# Patient Record
Sex: Male | Born: 1970 | Race: Black or African American | Hispanic: No | Smoking: Never smoker
Health system: Southern US, Community
[De-identification: ages and names within clinical notes are randomized; demographics above are authoritative.]

## PROBLEM LIST (undated history)

## (undated) DIAGNOSIS — S02609A Fracture of mandible, unspecified, initial encounter for closed fracture: Secondary | ICD-10-CM

## (undated) HISTORY — PX: CHOLECYSTECTOMY: SHX55

---

## 2016-07-08 ENCOUNTER — Emergency Department (HOSPITAL_COMMUNITY): Payer: Self-pay | Admitting: Anesthesiology

## 2016-07-08 ENCOUNTER — Inpatient Hospital Stay: Admit: 2016-07-08 | Payer: Self-pay | Admitting: Otolaryngology

## 2016-07-08 ENCOUNTER — Encounter (HOSPITAL_COMMUNITY)
Admission: EM | Disposition: A | Discharge status: Home / Self Care | Payer: Self-pay | Source: Home / Self Care | Attending: Emergency Medicine

## 2016-07-08 ENCOUNTER — Emergency Department (HOSPITAL_COMMUNITY): Payer: Self-pay

## 2016-07-08 ENCOUNTER — Ambulatory Visit (HOSPITAL_COMMUNITY)
Admission: EM | Admit: 2016-07-08 | Discharge: 2016-07-08 | Disposition: A | Discharge status: Home / Self Care | Payer: Self-pay | Attending: Emergency Medicine | Admitting: Emergency Medicine

## 2016-07-08 ENCOUNTER — Encounter (HOSPITAL_COMMUNITY): Payer: Self-pay

## 2016-07-08 DIAGNOSIS — Y998 Other external cause status: Secondary | ICD-10-CM | POA: Insufficient documentation

## 2016-07-08 DIAGNOSIS — Y9289 Other specified places as the place of occurrence of the external cause: Secondary | ICD-10-CM | POA: Insufficient documentation

## 2016-07-08 DIAGNOSIS — Y9389 Activity, other specified: Secondary | ICD-10-CM | POA: Insufficient documentation

## 2016-07-08 DIAGNOSIS — S02601B Fracture of unspecified part of body of right mandible, initial encounter for open fracture: Secondary | ICD-10-CM

## 2016-07-08 DIAGNOSIS — S02609B Fracture of mandible, unspecified, initial encounter for open fracture: Secondary | ICD-10-CM

## 2016-07-08 DIAGNOSIS — S02642A Fracture of ramus of left mandible, initial encounter for closed fracture: Secondary | ICD-10-CM | POA: Insufficient documentation

## 2016-07-08 DIAGNOSIS — S0266XA Fracture of symphysis of mandible, initial encounter for closed fracture: Secondary | ICD-10-CM | POA: Insufficient documentation

## 2016-07-08 HISTORY — PX: ORIF MANDIBULAR FRACTURE: SHX2127

## 2016-07-08 LAB — CBC WITH DIFFERENTIAL/PLATELET
BASOS ABS: 0 10*3/uL (ref 0.0–0.1)
Basophils Relative: 0 %
Eosinophils Absolute: 0.1 10*3/uL (ref 0.0–0.7)
Eosinophils Relative: 1 %
HEMATOCRIT: 39.1 % (ref 39.0–52.0)
HEMOGLOBIN: 13.6 g/dL (ref 13.0–17.0)
Lymphocytes Relative: 11 %
Lymphs Abs: 1 10*3/uL (ref 0.7–4.0)
MCH: 29.9 pg (ref 26.0–34.0)
MCHC: 34.8 g/dL (ref 30.0–36.0)
MCV: 85.9 fL (ref 78.0–100.0)
MONOS PCT: 4 %
Monocytes Absolute: 0.4 10*3/uL (ref 0.1–1.0)
NEUTROS ABS: 7.6 10*3/uL (ref 1.7–7.7)
NEUTROS PCT: 84 %
Platelets: 236 10*3/uL (ref 150–400)
RBC: 4.55 MIL/uL (ref 4.22–5.81)
RDW: 13.1 % (ref 11.5–15.5)
WBC: 9.1 10*3/uL (ref 4.0–10.5)

## 2016-07-08 LAB — BASIC METABOLIC PANEL
ANION GAP: 9 (ref 5–15)
BUN: 13 mg/dL (ref 6–20)
CO2: 27 mmol/L (ref 22–32)
Calcium: 9 mg/dL (ref 8.9–10.3)
Chloride: 103 mmol/L (ref 101–111)
Creatinine, Ser: 1.12 mg/dL (ref 0.61–1.24)
Glucose, Bld: 122 mg/dL — ABNORMAL HIGH (ref 65–99)
Potassium: 3.8 mmol/L (ref 3.5–5.1)
Sodium: 139 mmol/L (ref 135–145)

## 2016-07-08 SURGERY — OPEN REDUCTION INTERNAL FIXATION (ORIF) MANDIBULAR FRACTURE
Anesthesia: General | Site: Mouth | Laterality: Bilateral

## 2016-07-08 MED ORDER — PROPOFOL 10 MG/ML IV BOLUS
INTRAVENOUS | Status: AC
  Filled 2016-07-08: qty 40

## 2016-07-08 MED ORDER — LIDOCAINE HCL (CARDIAC) 20 MG/ML IV SOLN
INTRAVENOUS | Status: DC | PRN
  Administered 2016-07-08: 100 mg via INTRAVENOUS

## 2016-07-08 MED ORDER — AMOXICILLIN-POT CLAVULANATE 250-62.5 MG/5ML PO SUSR: 10.0000 mL | Freq: Two times a day (BID) | ORAL | 0 refills | Status: DC

## 2016-07-08 MED ORDER — PROPOFOL 10 MG/ML IV BOLUS
INTRAVENOUS | Status: DC | PRN
  Administered 2016-07-08: 200 mg via INTRAVENOUS

## 2016-07-08 MED ORDER — FENTANYL CITRATE (PF) 100 MCG/2ML IJ SOLN
50.0000 ug | Freq: Once | INTRAMUSCULAR | Status: AC
  Administered 2016-07-08: 50 ug via INTRAVENOUS
  Filled 2016-07-08: qty 2

## 2016-07-08 MED ORDER — SODIUM CHLORIDE 0.9 % IV SOLN
Freq: Once | INTRAVENOUS | Status: AC
  Administered 2016-07-08: 08:00:00 via INTRAVENOUS

## 2016-07-08 MED ORDER — CEFAZOLIN SODIUM 1 G IJ SOLR
2.0000 g | Freq: Once | INTRAMUSCULAR | Status: AC
  Administered 2016-07-08: 2 g via INTRAMUSCULAR
  Filled 2016-07-08: qty 20

## 2016-07-08 MED ORDER — ARTIFICIAL TEARS OP OINT
TOPICAL_OINTMENT | OPHTHALMIC | Status: AC
  Filled 2016-07-08: qty 3.5

## 2016-07-08 MED ORDER — HYDROMORPHONE HCL 1 MG/ML IJ SOLN
0.5000 mg | Freq: Once | INTRAMUSCULAR | Status: AC
  Administered 2016-07-08: 0.5 mg via INTRAVENOUS
  Filled 2016-07-08: qty 1

## 2016-07-08 MED ORDER — MIDAZOLAM HCL 2 MG/2ML IJ SOLN
INTRAMUSCULAR | Status: AC
  Filled 2016-07-08: qty 2

## 2016-07-08 MED ORDER — CEFAZOLIN SODIUM 1 G IJ SOLR
INTRAMUSCULAR | Status: AC
  Filled 2016-07-08: qty 20

## 2016-07-08 MED ORDER — FENTANYL CITRATE (PF) 100 MCG/2ML IJ SOLN
INTRAMUSCULAR | Status: DC | PRN
  Administered 2016-07-08: 150 ug via INTRAVENOUS
  Administered 2016-07-08: 50 ug via INTRAVENOUS

## 2016-07-08 MED ORDER — SUCCINYLCHOLINE CHLORIDE 200 MG/10ML IV SOSY
PREFILLED_SYRINGE | INTRAVENOUS | Status: AC
  Filled 2016-07-08: qty 10

## 2016-07-08 MED ORDER — DEXMEDETOMIDINE HCL 200 MCG/2ML IV SOLN
INTRAVENOUS | Status: DC | PRN
  Administered 2016-07-08 (×2): 8 ug via INTRAVENOUS

## 2016-07-08 MED ORDER — ARTIFICIAL TEARS OP OINT
TOPICAL_OINTMENT | OPHTHALMIC | Status: DC | PRN
  Administered 2016-07-08: 1 via OPHTHALMIC

## 2016-07-08 MED ORDER — OXYMETAZOLINE HCL 0.05 % NA SOLN
NASAL | Status: DC | PRN
  Administered 2016-07-08: 1 via NASAL

## 2016-07-08 MED ORDER — ONDANSETRON HCL 4 MG/2ML IJ SOLN
4.0000 mg | Freq: Once | INTRAMUSCULAR | Status: AC
  Administered 2016-07-08: 4 mg via INTRAVENOUS
  Filled 2016-07-08: qty 2

## 2016-07-08 MED ORDER — LIDOCAINE 2% (20 MG/ML) 5 ML SYRINGE
INTRAMUSCULAR | Status: AC
  Filled 2016-07-08: qty 10

## 2016-07-08 MED ORDER — PROMETHAZINE HCL 25 MG/ML IJ SOLN: 6.2500 mg | INTRAMUSCULAR | Status: DC | PRN

## 2016-07-08 MED ORDER — OXYMETAZOLINE HCL 0.05 % NA SOLN
NASAL | Status: DC | PRN
  Administered 2016-07-08: 1 via TOPICAL

## 2016-07-08 MED ORDER — DEXAMETHASONE SODIUM PHOSPHATE 10 MG/ML IJ SOLN
INTRAMUSCULAR | Status: AC
  Filled 2016-07-08: qty 1

## 2016-07-08 MED ORDER — OXYMETAZOLINE HCL 0.05 % NA SOLN
NASAL | Status: AC
  Filled 2016-07-08: qty 15

## 2016-07-08 MED ORDER — ONDANSETRON HCL 4 MG/2ML IJ SOLN
INTRAMUSCULAR | Status: AC
  Filled 2016-07-08: qty 2

## 2016-07-08 MED ORDER — CEFAZOLIN SODIUM 1 G IJ SOLR
INTRAMUSCULAR | Status: DC | PRN
  Administered 2016-07-08: 2 g via INTRAMUSCULAR

## 2016-07-08 MED ORDER — TETANUS-DIPHTH-ACELL PERTUSSIS 5-2.5-18.5 LF-MCG/0.5 IM SUSP
0.5000 mL | Freq: Once | INTRAMUSCULAR | Status: AC
  Administered 2016-07-08: 0.5 mL via INTRAMUSCULAR
  Filled 2016-07-08: qty 0.5

## 2016-07-08 MED ORDER — DEXAMETHASONE SODIUM PHOSPHATE 10 MG/ML IJ SOLN
INTRAMUSCULAR | Status: DC | PRN
  Administered 2016-07-08: 5 mg via INTRAVENOUS

## 2016-07-08 MED ORDER — HYDROCODONE-ACETAMINOPHEN 7.5-325 MG/15ML PO SOLN: 10.0000 mL | ORAL | 0 refills | Status: DC | PRN

## 2016-07-08 MED ORDER — SODIUM CHLORIDE 0.9 % IV SOLN
INTRAVENOUS | Status: DC | PRN
  Administered 2016-07-08: 09:00:00 via INTRAVENOUS

## 2016-07-08 MED ORDER — DEXMEDETOMIDINE HCL IN NACL 200 MCG/50ML IV SOLN
INTRAVENOUS | Status: AC
  Filled 2016-07-08: qty 50

## 2016-07-08 MED ORDER — MIDAZOLAM HCL 5 MG/5ML IJ SOLN
INTRAMUSCULAR | Status: DC | PRN
  Administered 2016-07-08: 1 mg via INTRAVENOUS

## 2016-07-08 MED ORDER — FENTANYL CITRATE (PF) 100 MCG/2ML IJ SOLN
INTRAMUSCULAR | Status: AC
  Filled 2016-07-08: qty 4

## 2016-07-08 MED ORDER — FENTANYL CITRATE (PF) 100 MCG/2ML IJ SOLN
25.0000 ug | INTRAMUSCULAR | Status: DC | PRN
  Administered 2016-07-08: 25 ug via INTRAVENOUS

## 2016-07-08 MED ORDER — ONDANSETRON HCL 4 MG/2ML IJ SOLN
INTRAMUSCULAR | Status: DC | PRN
  Administered 2016-07-08: 4 mg via INTRAVENOUS

## 2016-07-08 MED ORDER — 0.9 % SODIUM CHLORIDE (POUR BTL) OPTIME
TOPICAL | Status: DC | PRN
  Administered 2016-07-08: 1000 mL

## 2016-07-08 MED ORDER — SUCCINYLCHOLINE CHLORIDE 20 MG/ML IJ SOLN
INTRAMUSCULAR | Status: DC | PRN
  Administered 2016-07-08: 100 mg via INTRAVENOUS

## 2016-07-08 MED ORDER — STERILE WATER FOR INJECTION IJ SOLN
INTRAMUSCULAR | Status: AC
Start: 2016-07-08 — End: 2016-07-08
  Administered 2016-07-08: 5 mL
  Filled 2016-07-08: qty 10

## 2016-07-08 MED ORDER — LACTATED RINGERS IV SOLN
INTRAVENOUS | Status: DC | PRN
  Administered 2016-07-08: 10:00:00 via INTRAVENOUS

## 2016-07-08 SURGICAL SUPPLY — 41 items
BLADE SURG 15 STRL LF DISP TIS (BLADE) ×1 IMPLANT
BLADE SURG 15 STRL SS (BLADE) ×1
CANISTER SUCTION 2500CC (MISCELLANEOUS) ×2 IMPLANT
CLEANER TIP ELECTROSURG 2X2 (MISCELLANEOUS) ×2 IMPLANT
DRAPE PROXIMA HALF (DRAPES) IMPLANT
ELECT COATED BLADE 2.86 ST (ELECTRODE) ×2 IMPLANT
ELECT NEEDLE TIP 2.8 STRL (NEEDLE) IMPLANT
ELECT REM PT RETURN 9FT ADLT (ELECTROSURGICAL) ×2
ELECTRODE REM PT RTRN 9FT ADLT (ELECTROSURGICAL) ×1 IMPLANT
GLOVE BIOGEL M 7.0 STRL (GLOVE) ×2 IMPLANT
GOWN STRL REUS W/ TWL LRG LVL3 (GOWN DISPOSABLE) ×2 IMPLANT
GOWN STRL REUS W/TWL LRG LVL3 (GOWN DISPOSABLE) ×2
KIT BASIN OR (CUSTOM PROCEDURE TRAY) ×2 IMPLANT
KIT ROOM TURNOVER OR (KITS) ×2 IMPLANT
NEEDLE HYPO 25GX1X1/2 BEV (NEEDLE) ×2 IMPLANT
NS IRRIG 1000ML POUR BTL (IV SOLUTION) ×2 IMPLANT
PAD ARMBOARD 7.5X6 YLW CONV (MISCELLANEOUS) ×4 IMPLANT
PATTIES SURGICAL .5 X3 (DISPOSABLE) ×2 IMPLANT
PENCIL BUTTON HOLSTER BLD 10FT (ELECTRODE) ×2 IMPLANT
PLATE HYBRID MMF (Plate) ×4 IMPLANT
SCISSORS WIRE DISP (INSTRUMENTS) ×2 IMPLANT
SCREW LOCK SELFDRIL 2.0X8M MMF (Screw) ×22 IMPLANT
SCREW LOCKING SELF DRILL 2.0X6 (Screw) ×16 IMPLANT
STAPLER VISISTAT 35W (STAPLE) ×2 IMPLANT
SUT BONE WAX W31G (SUTURE) IMPLANT
SUT CHROMIC 3 0 SH 27 (SUTURE) IMPLANT
SUT ETHILON 3 0 PS 1 (SUTURE) IMPLANT
SUT SILK 3 0 (SUTURE)
SUT SILK 3 0 SH 30 (SUTURE) IMPLANT
SUT SILK 3-0 18XBRD TIE 12 (SUTURE) IMPLANT
SUT STEEL 0 (SUTURE)
SUT STEEL 0 18XMFL TIE 17 (SUTURE) IMPLANT
SUT STEEL 2 (SUTURE) ×2 IMPLANT
SUT VIC AB 3-0 FS2 27 (SUTURE) IMPLANT
SUT VIC AB 4-0 P-3 18X BRD (SUTURE) IMPLANT
SUT VIC AB 4-0 P3 18 (SUTURE)
SUT VIC AB 5-0 P-3 18XBRD (SUTURE) IMPLANT
SUT VIC AB 5-0 P3 18 (SUTURE)
TOWEL OR 17X24 6PK STRL BLUE (TOWEL DISPOSABLE) ×2 IMPLANT
TRAY ENT MC OR (CUSTOM PROCEDURE TRAY) ×2 IMPLANT
WATER STERILE IRR 1000ML POUR (IV SOLUTION) ×2 IMPLANT

## 2016-07-08 NOTE — ED Notes (Signed)
[facility] arrived to [facility].
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
## 2016-07-08 NOTE — Anesthesia Procedure Notes (Signed)
Procedure Name: Intubation Date/Time: 07/08/2016 8:56 AM Performed by: Edmonia CaprioAUSTON, Myishia Kasik M Pre-anesthesia Checklist: Patient identified, Emergency Drugs available, Suction available, Patient being monitored and Timeout performed Patient Re-evaluated:Patient Re-evaluated prior to inductionOxygen Delivery Method: Circle system utilized Preoxygenation: Pre-oxygenation with 100% oxygen Intubation Type: IV induction and Rapid sequence Laryngoscope Size: Miller, 2 and Glidescope Grade View: Grade II Nasal Tubes: Left, Magill forceps- large, utilized and Nasal Rae Tube size: 7.5 mm Number of attempts: 2 Placement Confirmation: ETT inserted through vocal cords under direct vision,  positive ETCO2 and breath sounds checked- equal and bilateral Tube secured with: Tape Dental Injury: Teeth and Oropharynx as per pre-operative assessment  Comments: Bilateral nares prepped with Afrin pre-op 7.5 nasal rae prewarmed and softened in warm saline, lubricated well Passed easily and slowly through left nare DL x1 with Mil 2, unable to pick up epiglottis; gr 1 view with glidescope, visualized nasal rae through cords.

## 2016-07-08 NOTE — Transfer of Care (Signed)
Immediate Anesthesia Transfer of Care Note  Patient: Merla RichesArchie XXXMATKINS  Procedure(s) Performed: Procedure(s): CLOSED REDUCTION AND MAXILLOMANDIBULAR FIXATION SCREW PLACEMENT OF MANDIBULAR FRACTURE (Bilateral)  Patient Location: PACU  Anesthesia Type:General  Level of Consciousness: sedated  Airway & Oxygen Therapy: Patient Spontanous Breathing and Patient connected to face mask oxygen  Post-op Assessment: Report given to RN, Post -op Vital signs reviewed and stable and Patient moving all extremities X 4  Post vital signs: Reviewed and stable  Last Vitals:  Vitals:   07/08/16 0800 07/08/16 0815  BP: 110/79 116/71  Pulse:  92  Resp:  19  Temp:      Last Pain:  Vitals:   07/08/16 0747  TempSrc:   PainSc: 10-Worst pain ever         Complications: No apparent anesthesia complications

## 2016-07-08 NOTE — Anesthesia Postprocedure Evaluation (Signed)
Anesthesia Post Note  Patient: Justin Dickson  Procedure(s) Performed: Procedure(s) (LRB): CLOSED REDUCTION AND MAXILLOMANDIBULAR FIXATION SCREW PLACEMENT OF MANDIBULAR FRACTURE (Bilateral)  Patient location during evaluation: PACU Anesthesia Type: General Level of consciousness: awake and alert Pain management: pain level controlled Vital Signs Assessment: post-procedure vital signs reviewed and stable Respiratory status: spontaneous breathing, nonlabored ventilation, respiratory function stable and patient connected to nasal cannula oxygen Cardiovascular status: blood pressure returned to baseline and stable Postop Assessment: no signs of nausea or vomiting Anesthetic complications: no    Last Vitals:  Vitals:   07/08/16 1115 07/08/16 1120  BP:  (!) 144/92  Pulse:  76  Resp:  (!) 0  Temp: 37.2 C     Last Pain:  Vitals:   07/08/16 1120  TempSrc:   PainSc: 5                  Cecile HearingStephen Edward Casidee Jann

## 2016-07-08 NOTE — Progress Notes (Signed)
Password for this admission [id number]
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
## 2016-07-08 NOTE — Anesthesia Preprocedure Evaluation (Signed)
Anesthesia Evaluation  Patient identified by MRN, date of birth, ID band Patient awake    Reviewed: Allergy & Precautions, NPO status , Patient's Chart, lab work & pertinent test results  Airway Mallampati: II  TM Distance: >3 FB Neck ROM: Full  Mouth opening: Limited Mouth Opening  Dental  (+) Teeth Intact, Dental Advisory Given   Pulmonary neg pulmonary ROS,    Pulmonary exam normal breath sounds clear to auscultation       Cardiovascular negative cardio ROS Normal cardiovascular exam Rhythm:Regular Rate:Normal     Neuro/Psych negative neurological ROS     GI/Hepatic negative GI ROS, Neg liver ROS,   Endo/Other  negative endocrine ROS  Renal/GU negative Renal ROS     Musculoskeletal negative musculoskeletal ROS (+)   Abdominal   Peds  Hematology negative hematology ROS (+)   Anesthesia Other Findings Day of surgery medications reviewed with the patient.  Reproductive/Obstetrics                             Anesthesia Physical Anesthesia Plan  ASA: I  Anesthesia Plan: General   Post-op Pain Management:    Induction: Intravenous  Airway Management Planned: Nasal ETT  Additional Equipment:   Intra-op Plan:   Post-operative Plan: Extubation in OR  Informed Consent: I have reviewed the patients History and Physical, chart, labs and discussed the procedure including the risks, benefits and alternatives for the proposed anesthesia with the patient or authorized representative who has indicated his/her understanding and acceptance.   Dental advisory given  Plan Discussed with: CRNA  Anesthesia Plan Comments: (Risks/benefits of general anesthesia discussed with patient including risk of damage to teeth, lips, gum, and tongue, nausea/vomiting, allergic reactions to medications, and the possibility of heart attack, stroke and death.  All patient questions answered.  Patient wishes to  proceed.)        Anesthesia Quick Evaluation
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
## 2016-07-11 ENCOUNTER — Encounter (HOSPITAL_COMMUNITY): Payer: Self-pay | Admitting: Otolaryngology

## 2016-07-31 DIAGNOSIS — S02601D Fracture of unspecified part of body of right mandible, subsequent encounter for fracture with routine healing: Secondary | ICD-10-CM | POA: Insufficient documentation

## 2016-08-30 ENCOUNTER — Other Ambulatory Visit: Payer: Self-pay | Admitting: Otolaryngology

## 2016-09-05 ENCOUNTER — Encounter (HOSPITAL_BASED_OUTPATIENT_CLINIC_OR_DEPARTMENT_OTHER): Payer: Self-pay | Admitting: *Deleted

## 2016-09-08 ENCOUNTER — Ambulatory Visit (HOSPITAL_BASED_OUTPATIENT_CLINIC_OR_DEPARTMENT_OTHER): Payer: BLUE CROSS/BLUE SHIELD | Admitting: Certified Registered"

## 2016-09-08 ENCOUNTER — Ambulatory Visit (HOSPITAL_BASED_OUTPATIENT_CLINIC_OR_DEPARTMENT_OTHER)
Admission: RE | Admit: 2016-09-08 | Discharge: 2016-09-08 | Disposition: A | Discharge status: Home / Self Care | Payer: BLUE CROSS/BLUE SHIELD | Source: Ambulatory Visit | Attending: Otolaryngology | Admitting: Otolaryngology

## 2016-09-08 ENCOUNTER — Encounter (HOSPITAL_BASED_OUTPATIENT_CLINIC_OR_DEPARTMENT_OTHER): Payer: Self-pay | Admitting: Certified Registered"

## 2016-09-08 ENCOUNTER — Encounter (HOSPITAL_BASED_OUTPATIENT_CLINIC_OR_DEPARTMENT_OTHER)
Admission: RE | Disposition: A | Discharge status: Home / Self Care | Payer: Self-pay | Source: Ambulatory Visit | Attending: Otolaryngology

## 2016-09-08 DIAGNOSIS — S0269XD Fracture of mandible of other specified site, subsequent encounter for fracture with routine healing: Secondary | ICD-10-CM | POA: Diagnosis present

## 2016-09-08 HISTORY — DX: Fracture of mandible, unspecified, initial encounter for closed fracture: S02.609A

## 2016-09-08 HISTORY — PX: MANDIBULAR HARDWARE REMOVAL: SHX5205

## 2016-09-08 SURGERY — REMOVAL, HARDWARE, MANDIBLE
Anesthesia: General | Site: Mouth

## 2016-09-08 MED ORDER — NEOMYCIN-POLYMYXIN-DEXAMETH 3.5-10000-0.1 OP OINT
TOPICAL_OINTMENT | OPHTHALMIC | Status: AC
  Filled 2016-09-08: qty 3.5

## 2016-09-08 MED ORDER — FENTANYL CITRATE (PF) 100 MCG/2ML IJ SOLN
50.0000 ug | INTRAMUSCULAR | Status: DC | PRN
  Administered 2016-09-08: 60 ug via INTRAVENOUS
  Administered 2016-09-08: 50 ug via INTRAVENOUS

## 2016-09-08 MED ORDER — LIDOCAINE 2% (20 MG/ML) 5 ML SYRINGE
INTRAMUSCULAR | Status: AC
  Filled 2016-09-08: qty 5

## 2016-09-08 MED ORDER — ONDANSETRON HCL 4 MG/2ML IJ SOLN: 4.0000 mg | Freq: Four times a day (QID) | INTRAMUSCULAR | Status: DC | PRN

## 2016-09-08 MED ORDER — PROPOFOL 500 MG/50ML IV EMUL
INTRAVENOUS | Status: AC
  Filled 2016-09-08: qty 50

## 2016-09-08 MED ORDER — SCOPOLAMINE 1 MG/3DAYS TD PT72: 1.0000 | MEDICATED_PATCH | Freq: Once | TRANSDERMAL | Status: DC | PRN

## 2016-09-08 MED ORDER — HYDROMORPHONE HCL 1 MG/ML IJ SOLN
INTRAMUSCULAR | Status: AC
  Filled 2016-09-08: qty 1

## 2016-09-08 MED ORDER — CHLORHEXIDINE GLUCONATE CLOTH 2 % EX PADS: 6.0000 | MEDICATED_PAD | Freq: Once | CUTANEOUS | Status: DC

## 2016-09-08 MED ORDER — FENTANYL CITRATE (PF) 100 MCG/2ML IJ SOLN
INTRAMUSCULAR | Status: AC
  Filled 2016-09-08: qty 2

## 2016-09-08 MED ORDER — OXYCODONE HCL 5 MG/5ML PO SOLN: 5.0000 mg | Freq: Once | ORAL | Status: DC | PRN

## 2016-09-08 MED ORDER — LIDOCAINE-EPINEPHRINE 2 %-1:100000 IJ SOLN
INTRAMUSCULAR | Status: AC
  Filled 2016-09-08: qty 3.4

## 2016-09-08 MED ORDER — LIDOCAINE-EPINEPHRINE 2 %-1:100000 IJ SOLN
INTRAMUSCULAR | Status: DC | PRN
  Administered 2016-09-08: 3.4 mL

## 2016-09-08 MED ORDER — PROPOFOL 10 MG/ML IV BOLUS
INTRAVENOUS | Status: DC | PRN
  Administered 2016-09-08: 20 mg via INTRAVENOUS

## 2016-09-08 MED ORDER — ONDANSETRON HCL 4 MG/2ML IJ SOLN
INTRAMUSCULAR | Status: AC
  Filled 2016-09-08: qty 2

## 2016-09-08 MED ORDER — ONDANSETRON HCL 4 MG/2ML IJ SOLN
INTRAMUSCULAR | Status: DC | PRN
  Administered 2016-09-08: 4 mg via INTRAVENOUS

## 2016-09-08 MED ORDER — MIDAZOLAM HCL 2 MG/2ML IJ SOLN
INTRAMUSCULAR | Status: AC
  Filled 2016-09-08: qty 2

## 2016-09-08 MED ORDER — ONDANSETRON HCL 4 MG/2ML IJ SOLN
INTRAMUSCULAR | Status: AC
Start: 2016-09-08 — End: 2016-09-08
  Filled 2016-09-08: qty 10

## 2016-09-08 MED ORDER — BUPIVACAINE-EPINEPHRINE (PF) 0.25% -1:200000 IJ SOLN
INTRAMUSCULAR | Status: AC
  Filled 2016-09-08: qty 30

## 2016-09-08 MED ORDER — OXYCODONE HCL 5 MG PO TABS: 5.0000 mg | ORAL_TABLET | Freq: Once | ORAL | Status: DC | PRN

## 2016-09-08 MED ORDER — LACTATED RINGERS IV SOLN
INTRAVENOUS | Status: DC
  Administered 2016-09-08 (×2): via INTRAVENOUS

## 2016-09-08 MED ORDER — HYDROMORPHONE HCL 1 MG/ML IJ SOLN
0.2500 mg | INTRAMUSCULAR | Status: DC | PRN
  Administered 2016-09-08 (×2): 0.5 mg via INTRAVENOUS

## 2016-09-08 MED ORDER — LIDOCAINE HCL (PF) 1 % IJ SOLN
INTRAMUSCULAR | Status: AC
  Filled 2016-09-08: qty 30

## 2016-09-08 MED ORDER — AMOXICILLIN-POT CLAVULANATE 500-125 MG PO TABS: 1.0000 | ORAL_TABLET | Freq: Two times a day (BID) | ORAL | 0 refills | Status: DC

## 2016-09-08 MED ORDER — MIDAZOLAM HCL 2 MG/2ML IJ SOLN
1.0000 mg | INTRAMUSCULAR | Status: DC | PRN
  Administered 2016-09-08: 2 mg via INTRAVENOUS

## 2016-09-08 SURGICAL SUPPLY — 34 items
BLADE SURG 15 STRL LF DISP TIS (BLADE) ×1 IMPLANT
BLADE SURG 15 STRL SS (BLADE) ×2
CANISTER SUCT 1200ML W/VALVE (MISCELLANEOUS) ×3 IMPLANT
COVER MAYO STAND STRL (DRAPES) ×3 IMPLANT
DECANTER SPIKE VIAL GLASS SM (MISCELLANEOUS) IMPLANT
ELECT COATED BLADE 2.86 ST (ELECTRODE) ×3 IMPLANT
ELECT REM PT RETURN 9FT ADLT (ELECTROSURGICAL) ×3
ELECTRODE REM PT RTRN 9FT ADLT (ELECTROSURGICAL) ×1 IMPLANT
GAUZE SPONGE 4X4 16PLY XRAY LF (GAUZE/BANDAGES/DRESSINGS) IMPLANT
GLOVE BIOGEL M 7.0 STRL (GLOVE) ×3 IMPLANT
GLOVE BIOGEL PI IND STRL 7.0 (GLOVE) ×1 IMPLANT
GLOVE BIOGEL PI INDICATOR 7.0 (GLOVE) ×2
GLOVE ECLIPSE 6.5 STRL STRAW (GLOVE) ×3 IMPLANT
GOWN STRL REUS W/ TWL LRG LVL3 (GOWN DISPOSABLE) ×1 IMPLANT
GOWN STRL REUS W/TWL LRG LVL3 (GOWN DISPOSABLE) ×2
MARKER SKIN DUAL TIP RULER LAB (MISCELLANEOUS) IMPLANT
NEEDLE DENTAL 27 LONG (NEEDLE) ×3 IMPLANT
NEEDLE PRECISIONGLIDE 27X1.5 (NEEDLE) ×3 IMPLANT
NS IRRIG 1000ML POUR BTL (IV SOLUTION) ×3 IMPLANT
PACK BASIN DAY SURGERY FS (CUSTOM PROCEDURE TRAY) ×3 IMPLANT
PENCIL BUTTON HOLSTER BLD 10FT (ELECTRODE) ×3 IMPLANT
SCISSORS WIRE ANG 4 3/4 DISP (INSTRUMENTS) IMPLANT
SHEET MEDIUM DRAPE 40X70 STRL (DRAPES) ×3 IMPLANT
SPONGE GAUZE 4X4 12PLY STER LF (GAUZE/BANDAGES/DRESSINGS) ×6 IMPLANT
SUT CHROMIC 3 0 PS 2 (SUTURE) IMPLANT
SUT CHROMIC 4 0 PS 2 18 (SUTURE) IMPLANT
SUT CHROMIC 4 0 RB 1X27 (SUTURE) IMPLANT
SYR BULB 3OZ (MISCELLANEOUS) ×3 IMPLANT
SYR CONTROL 10ML LL (SYRINGE) IMPLANT
TOWEL OR 17X24 6PK STRL BLUE (TOWEL DISPOSABLE) ×3 IMPLANT
TRAY DSU PREP LF (CUSTOM PROCEDURE TRAY) IMPLANT
TUBE CONNECTING 20'X1/4 (TUBING) ×1
TUBE CONNECTING 20X1/4 (TUBING) ×2 IMPLANT
YANKAUER SUCT BULB TIP NO VENT (SUCTIONS) ×3 IMPLANT

## 2016-09-08 NOTE — Anesthesia Preprocedure Evaluation (Signed)
Anesthesia Evaluation  Patient identified by MRN, date of birth, ID band Patient awake    Reviewed: Allergy & Precautions, NPO status , Patient's Chart, lab work & pertinent test results  Airway        Dental   Pulmonary           Cardiovascular      Neuro/Psych    GI/Hepatic   Endo/Other    Renal/GU      Musculoskeletal   Abdominal   Peds  Hematology   Anesthesia Other Findings   Reproductive/Obstetrics                             Anesthesia Physical Anesthesia Plan Anesthesia Quick Evaluation  

## 2016-09-08 NOTE — Anesthesia Procedure Notes (Signed)
Procedure Name: MAC Date/Time: 09/08/2016 7:35 AM Performed by: Ambree Frances D Pre-anesthesia Checklist: Patient identified, Emergency Drugs available, Suction available, Patient being monitored and Timeout performed Patient Re-evaluated:Patient Re-evaluated prior to inductionOxygen Delivery Method: Simple face mask

## 2016-09-08 NOTE — Transfer of Care (Signed)
Immediate Anesthesia Transfer of Care Note  Patient: Justin Dickson  Procedure(s) Performed: Procedure(s): MANDIBULAR HARDWARE REMOVAL (N/A)  Patient Location: PACU  Anesthesia Type:General  Level of Consciousness: awake and patient cooperative  Airway & Oxygen Therapy: Patient Spontanous Breathing and Patient connected to face mask oxygen  Post-op Assessment: Report given to RN and Post -op Vital signs reviewed and stable  Post vital signs: Reviewed and stable  Last Vitals:  Vitals:   09/08/16 0649  BP: 128/77  Pulse: 61  Resp: 18  Temp: 36.9 C    Last Pain:  Vitals:   09/08/16 0649  TempSrc: Oral      Patients Stated Pain Goal: 0 (09/08/16 0649)  Complications: No apparent anesthesia complications

## 2016-09-08 NOTE — Discharge Instructions (Signed)

## 2016-09-08 NOTE — Anesthesia Preprocedure Evaluation (Addendum)
Anesthesia Evaluation  Patient identified by MRN, date of birth, ID band Patient awake    Reviewed: Allergy & Precautions, H&P , NPO status , Patient's Chart, lab work & pertinent test results  Airway Mallampati: II   Neck ROM: full    Dental   Pulmonary neg pulmonary ROS,    breath sounds clear to auscultation       Cardiovascular negative cardio ROS   Rhythm:regular Rate:Normal     Neuro/Psych    GI/Hepatic   Endo/Other    Renal/GU      Musculoskeletal   Abdominal   Peds  Hematology   Anesthesia Other Findings   Reproductive/Obstetrics                             Anesthesia Physical Anesthesia Plan  ASA: I  Anesthesia Plan: General   Post-op Pain Management:    Induction: Intravenous  Airway Management Planned: Mask  Additional Equipment:   Intra-op Plan:   Post-operative Plan:   Informed Consent: I have reviewed the patients History and Physical, chart, labs and discussed the procedure including the risks, benefits and alternatives for the proposed anesthesia with the patient or authorized representative who has indicated his/her understanding and acceptance.     Plan Discussed with: CRNA, Anesthesiologist and Surgeon  Anesthesia Plan Comments:         Anesthesia Quick Evaluation  

## 2016-09-08 NOTE — Anesthesia Postprocedure Evaluation (Signed)
Anesthesia Post Note  Patient: Justin Dickson  Procedure(s) Performed: Procedure(s) (LRB): MANDIBULAR HARDWARE REMOVAL (N/A)  Patient location during evaluation: PACU Anesthesia Type: General Level of consciousness: awake and alert and patient cooperative Pain management: pain level controlled Vital Signs Assessment: post-procedure vital signs reviewed and stable Respiratory status: spontaneous breathing and respiratory function stable Cardiovascular status: stable Anesthetic complications: no    Last Vitals:  Vitals:   09/08/16 0845 09/08/16 0900  BP: (!) 160/101 (!) 159/98  Pulse: 80 70  Resp: 16 16  Temp:      Last Pain:  Vitals:   09/08/16 0900  TempSrc:   PainSc: 4                  Ithan Touhey S

## 2016-09-08 NOTE — H&P (Signed)
Justin Dickson is an 45 y.o. male.   Chief Complaint: Mandible Fracture HPI: 2 months s/p MMF  Past Medical History:  Diagnosis Date  . Mandible fracture [facility](HCC)     Past Surgical History:  Procedure Laterality Date  . ORIF MANDIBULAR FRACTURE Bilateral 07/08/2016   Procedure: CLOSED REDUCTION AND MAXILLOMANDIBULAR FIXATION SCREW PLACEMENT OF MANDIBULAR FRACTURE;  Surgeon: Osborn Cohoavid Chayanne Filippi, MD;  [facility];  Service: ENT;  Laterality: Bilateral;    History reviewed. No pertinent family history. Social History:  reports that he has never smoked. He has never used smokeless tobacco. He reports that he drinks alcohol. He reports that he does not use drugs.  Allergies: No Known Allergies  No prescriptions prior to admission.    No results found for this or any previous visit (from the past 48 hour(s)). No results found.  Review of Systems  Constitutional: Negative.   HENT: Negative.   Respiratory: Negative.   Cardiovascular: Negative.     Blood pressure 128/77, pulse 61, temperature 98.4 F (36.9 C), temperature source Oral, resp. rate 18, height 5\' 9"  (1.753 m), weight 73 kg (161 lb), SpO2 98 %. Physical Exam  Constitutional: He appears well-developed and well-nourished.  HENT:  MMF  Neck: Normal range of motion. Neck supple.  Cardiovascular: Normal rate.   Respiratory: Effort normal.     Assessment/Plan Removal of MMF hardware  [name], MD 09/08/2016, 7:26 AM

## 2016-09-08 NOTE — Brief Op Note (Signed)
09/08/2016  8:24 AM  PATIENT:  Justin Dickson  45 y.o. male  PRE-OPERATIVE DIAGNOSIS:  OPEN FRACTURE OF RIGHT SIDE MANDIBLE  POST-OPERATIVE DIAGNOSIS:  OPEN FRACTURE OF RIGHT SIDE MANDIBLE  PROCEDURE:  Procedure(s): MANDIBULAR HARDWARE REMOVAL (N/A)  SURGEON:  Surgeon(s) and Role:    * Osborn Cohoavid Taner Rzepka, MD - Primary  PHYSICIAN ASSISTANT:   ASSISTANTS: none   ANESTHESIA:   general  EBL:  Total I/O In: 1000 [I.V.:1000] Out: - Min  BLOOD ADMINISTERED:none  DRAINS: none   LOCAL MEDICATIONS USED:  LIDOCAINE  and Amount: 3.4 ml  SPECIMEN:  No Specimen  DISPOSITION OF SPECIMEN:  N/A  COUNTS:  YES  TOURNIQUET:  * No tourniquets in log *  DICTATION: .Other Dictation: Dictation Number [id number]  PLAN OF CARE: Discharge to home after PACU  PATIENT DISPOSITION:  PACU - hemodynamically stable.   Delay start of Pharmacological VTE agent (>24hrs) due to surgical blood loss or risk of bleeding: not applicable

## 2016-09-11 ENCOUNTER — Encounter (HOSPITAL_BASED_OUTPATIENT_CLINIC_OR_DEPARTMENT_OTHER): Payer: Self-pay | Admitting: Otolaryngology
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
## 2023-12-17 DIAGNOSIS — I10 Essential (primary) hypertension: Secondary | ICD-10-CM | POA: Insufficient documentation

## 2024-06-02 NOTE — Progress Notes (Signed)
 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 Septic bursitis of elbow, left Comments: improved but still has thickened tissue Orders: -     meloxicam (MOBIC) 7.5 mg tablet; Take one tablet (7.5 mg dose) by mouth daily., Starting Mon 06/02/2024, Normal 3. Screening for colon cancer -     Ambulatory referral to Gastroenterology 4. Essential hypertension 5. Need for vaccination -     Flucelvax Trivalent Preservative Free Prefilled Syringe Other orders -     olmesartan (BENICAR) 20 MG tablet; Take one tablet (20 mg dose) by mouth daily., Starting Mon 06/02/2024, Normal    Discussed chondromalacia patella of his right knee.  He has had an injection in July.  He is doing well with compression sleeve.  I recommended he take intermittent meloxicam as needed for pain and swelling in his knee.  Septic bursitis of his elbow in July has now improved.  It is left residual scar tissue in his left olecranon bursa.  Intermittent tingling and swelling.  Today he is not symptomatic with it.  He will start meloxicam if it becomes symptomatic at any point in time.  He will stop the meloxicam when it feels better.  I did caution him that his meloxicam may raise his blood pressure.  He understands this and will take it sparingly.  Olmesartan 20 mg is controlling his blood pressure very well.  Blood pressure today 132/82.   Risks, benefits, and alternatives of the medications and treatment plan prescribed today were discussed, and patient expressed understanding. Plan follow-up as discussed or as needed if any worsening symptoms or change in condition.      Subjective   Justin Dickson is a 53 y.o. (DOB 10/08/1970) male who presents for the following:    Patient presents with  . Hypertension    Hypertension This is a chronic problem. The current episode  started more than 1 year ago. The problem is unchanged. The problem is controlled. Pertinent negatives include no anxiety, blurred vision, chest pain, headaches, malaise/fatigue, neck pain, orthopnea, palpitations, peripheral edema, PND, shortness of breath or sweats. Past treatments include angiotensin blockers. The current treatment provides significant improvement. There are no compliance problems.      Reviewed and updated this visit by provider: Tobacco  Allergies  Meds  Problems  Med Hx  Surg Hx  Fam Hx      Review of Systems  Constitutional:  Negative for malaise/fatigue.  Eyes:  Negative for blurred vision.  Respiratory:  Negative for shortness of breath.   Cardiovascular:  Negative for chest pain, palpitations, orthopnea and PND.  Musculoskeletal:  Negative for neck pain.  Neurological:  Negative for headaches.          Objective   Vitals:   06/02/24 1625  BP: 132/82  Patient Position: Sitting  Pulse: 98  Temp: 98.5 F (36.9 C)  TempSrc: Oral  Resp: 14  Height: 5' 9 (1.753 m)  Weight: 189 lb 9.6 oz (86 kg)  SpO2: 98%  BMI (Calculated): 28  PainSc: 0-No pain    Physical Exam Vitals and nursing note reviewed.  Constitutional:      General: He is not in acute distress.    Appearance: Normal appearance. He is well-developed. He is not ill-appearing, toxic-appearing or diaphoretic.  HENT:     Head: Normocephalic and atraumatic.  Right Ear: External ear normal.     Left Ear: External ear normal.     Nose: Nose normal.  Eyes:     Pupils: Pupils are equal, round, and reactive to light.  Neck:     Thyroid: No thyromegaly.     Vascular: No carotid bruit.     Trachea: No tracheal deviation.  Cardiovascular:     Rate and Rhythm: Normal rate and regular rhythm.     Heart sounds: Normal heart sounds. No murmur heard.    No friction rub. No gallop.  Musculoskeletal:     Cervical back: Normal range of motion and neck supple.     Comments: Patient is  independently ambulatory with a normal heel-toe gait both knees have active range of motion 0 to 120 degrees 2+ crepitus 2+ synovitis right knee has some medial peripatellar tenderness left knee has no pain.  Left elbow has full range of motion palpable thickening of his olecranon bursa no redness no warmth today no fluctuance.  Right elbow has full range of motion no thickening of his olecranon bursa no redness no warmth no fluctuance.  Pulmonary:     Effort: Pulmonary effort is normal. No respiratory distress.     Breath sounds: Normal breath sounds. No wheezing.  Abdominal:     General: Bowel sounds are normal. There is no distension.     Palpations: Abdomen is soft. There is no mass.     Tenderness: There is no abdominal tenderness. There is no guarding or rebound.     Hernia: No hernia is present.  Lymphadenopathy:     Cervical: No cervical adenopathy.  Skin:    General: Skin is warm and dry.     Coloration: Skin is not pale.     Findings: No erythema or rash.  Neurological:     Mental Status: He is alert and oriented to person, place, and time.     Gait: Gait normal.     Deep Tendon Reflexes: Reflexes are normal and symmetric.  Psychiatric:        Mood and Affect: Mood normal.        Behavior: Behavior normal.        Thought Content: Thought content normal.        Judgment: Judgment normal.

## 2024-06-03 ENCOUNTER — Encounter (INDEPENDENT_AMBULATORY_CARE_PROVIDER_SITE_OTHER): Payer: Self-pay | Admitting: *Deleted

## 2024-06-30 ENCOUNTER — Telehealth: Payer: Self-pay

## 2024-06-30 NOTE — Telephone Encounter (Signed)
 Who is your primary care physician: Robbi Brinks PA-C     [facility]   Reasons for the colonoscopy: Screening  Have you had a colonoscopy before?  no  Do you have family history of colon cancer? no  Previous colonoscopy with polyps removed? no  Do you have a history colorectal cancer?   no  Are you diabetic? If yes, Type 1 or Type 2?    no  Do you have a prosthetic or mechanical heart valve? no  Do you have a pacemaker/defibrillator?   no  Have you had endocarditis/atrial fibrillation? no  Have you had joint replacement within the last 12 months?  no  Do you tend to be constipated or have to use laxatives? no  Do you have any history of drugs or alchohol?  Yes 2-3 beers daily  Do you use supplemental oxygen?  no  Have you had a stroke or heart attack within the last 6 months? no  Do you take weight loss medication?  no   Do you take any blood-thinning medications such as: (aspirin, warfarin, Plavix, Aggrenox)  no  If yes we need the name, milligram, dosage and who is prescribing doctor  Current Outpatient Medications on File Prior to Visit  Medication Sig Dispense Refill   olmesartan (BENICAR) 20 MG tablet Take 20 mg by mouth daily.     No current facility-administered medications on file prior to visit.    No Known Allergies   Pharmacy: [facility]   Primary Insurance Name: [employer] [id number]  Best number where you can be reached: [phone]

## 2024-06-30 NOTE — Telephone Encounter (Signed)
 The patient called back.

## 2024-07-01 ENCOUNTER — Telehealth: Payer: Self-pay

## 2024-07-01 NOTE — Telephone Encounter (Signed)
 Who is your primary care physician: Gayl Brinks  Reasons for the colonoscopy: screening  Have you had a colonoscopy before?  no  Do you have family history of colon cancer? no  Previous colonoscopy with polyps removed? no  Do you have a history colorectal cancer?   no  Are you diabetic? If yes, Type 1 or Type 2?    no  Do you have a prosthetic or mechanical heart valve? no  Do you have a pacemaker/defibrillator?   no  Have you had endocarditis/atrial fibrillation? no  Have you had joint replacement within the last 12 months?  no  Do you tend to be constipated or have to use laxatives? no  Do you have any history of drugs or alchohol?  Yes 2-3 beers daily  Do you use supplemental oxygen?  no  Have you had a stroke or heart attack within the last 6 months? no  Do you take weight loss medication?  no    Do you take any blood-thinning medications such as: (aspirin, warfarin, Plavix, Aggrenox)  no  If yes we need the name, milligram, dosage and who is prescribing doctor  Current Outpatient Medications on File Prior to Visit  Medication Sig Dispense Refill   olmesartan (BENICAR) 20 MG tablet Take 20 mg by mouth daily.     No current facility-administered medications on file prior to visit.    No Known Allergies   Pharmacy: [facility]  Primary Insurance Name: [employer]  Best number where you can be reached: [phone]

## 2024-07-08 NOTE — Telephone Encounter (Signed)
 Any room Thanks

## 2024-07-09 MED ORDER — PEG 3350-KCL-NA BICARB-NACL 420 G PO SOLR: 4000.0000 mL | Freq: Once | ORAL | 0 refills | Status: AC

## 2024-07-09 NOTE — Addendum Note (Signed)
 Addended by: JEANELL GRAEME RAMAN on: 07/09/2024 09:27 AM   Modules accepted: Orders

## 2024-07-09 NOTE — Telephone Encounter (Signed)
 Spoke with pt. Scheduled fo r11/18. Aware will mail instructions and rx for prep to be sent to pharmacy.

## 2024-07-14 ENCOUNTER — Encounter (INDEPENDENT_AMBULATORY_CARE_PROVIDER_SITE_OTHER): Payer: Self-pay | Admitting: *Deleted

## 2024-07-14 NOTE — Telephone Encounter (Signed)
 Referral completed, TCS apt letter sent to PCP

## 2024-08-07 ENCOUNTER — Encounter (HOSPITAL_COMMUNITY): Payer: Self-pay

## 2024-08-07 ENCOUNTER — Encounter (HOSPITAL_COMMUNITY)
Admission: RE | Admit: 2024-08-07 | Discharge: 2024-08-07 | Disposition: A | Discharge status: Home / Self Care | Payer: PRIVATE HEALTH INSURANCE | Source: Ambulatory Visit | Attending: Gastroenterology | Admitting: Gastroenterology

## 2024-08-07 ENCOUNTER — Other Ambulatory Visit: Payer: Self-pay

## 2024-08-07 NOTE — Pre-Procedure Instructions (Signed)
 Attempted pre-op phone call. Left VM for him to call us back.

## 2024-08-12 ENCOUNTER — Ambulatory Visit (HOSPITAL_COMMUNITY): Payer: PRIVATE HEALTH INSURANCE | Admitting: Anesthesiology

## 2024-08-12 ENCOUNTER — Encounter (HOSPITAL_COMMUNITY): Payer: Self-pay | Admitting: Gastroenterology

## 2024-08-12 ENCOUNTER — Ambulatory Visit (HOSPITAL_COMMUNITY)
Admission: RE | Admit: 2024-08-12 | Discharge: 2024-08-12 | Disposition: A | Discharge status: Home / Self Care | Payer: PRIVATE HEALTH INSURANCE | Attending: Gastroenterology | Admitting: Gastroenterology

## 2024-08-12 ENCOUNTER — Encounter (HOSPITAL_COMMUNITY)
Admission: RE | Disposition: A | Discharge status: Home / Self Care | Payer: Self-pay | Source: Home / Self Care | Attending: Gastroenterology

## 2024-08-12 DIAGNOSIS — K5730 Diverticulosis of large intestine without perforation or abscess without bleeding: Secondary | ICD-10-CM | POA: Diagnosis not present

## 2024-08-12 DIAGNOSIS — K648 Other hemorrhoids: Secondary | ICD-10-CM

## 2024-08-12 DIAGNOSIS — Z1211 Encounter for screening for malignant neoplasm of colon: Secondary | ICD-10-CM

## 2024-08-12 DIAGNOSIS — I10 Essential (primary) hypertension: Secondary | ICD-10-CM | POA: Diagnosis not present

## 2024-08-12 DIAGNOSIS — D128 Benign neoplasm of rectum: Secondary | ICD-10-CM

## 2024-08-12 HISTORY — PX: COLONOSCOPY: SHX5424

## 2024-08-12 SURGERY — COLONOSCOPY
Anesthesia: General

## 2024-08-12 MED ORDER — PROPOFOL 10 MG/ML IV BOLUS
INTRAVENOUS | Status: DC | PRN
  Administered 2024-08-12: 175 ug/kg/min via INTRAVENOUS
  Administered 2024-08-12: 100 mg via INTRAVENOUS

## 2024-08-12 MED ORDER — LIDOCAINE HCL (CARDIAC) PF 100 MG/5ML IV SOSY
PREFILLED_SYRINGE | INTRAVENOUS | Status: DC | PRN
  Administered 2024-08-12: 50 mg via INTRAVENOUS

## 2024-08-12 MED ORDER — LACTATED RINGERS IV SOLN: INTRAVENOUS | Status: DC | PRN

## 2024-08-12 NOTE — Discharge Instructions (Addendum)
You are being discharged to home.  Resume your previous diet.  We are waiting for your pathology results.  Your physician has recommended a repeat colonoscopy (date to be determined after pending pathology results are reviewed) for screening purposes.  

## 2024-08-12 NOTE — Anesthesia Postprocedure Evaluation (Signed)
 Anesthesia Post Note  Patient: Justin Dickson  Procedure(s) Performed: COLONOSCOPY  Patient location during evaluation: Phase II Anesthesia Type: General Level of consciousness: awake and alert Pain management: pain level controlled Vital Signs Assessment: post-procedure vital signs reviewed and stable Respiratory status: spontaneous breathing, nonlabored ventilation and respiratory function stable Cardiovascular status: blood pressure returned to baseline and stable Postop Assessment: no apparent nausea or vomiting Anesthetic complications: no   There were no known notable events for this encounter.   Last Vitals:  Vitals:   08/12/24 0900 08/12/24 1022  BP: (!) 142/88 (!) 96/51  Pulse:  69  Resp: 17 18  Temp: 36.8 C 36.4 C  SpO2: 100% 98%    Last Pain:  Vitals:   08/12/24 1022  TempSrc: Oral  PainSc: 0-No pain                 Muriel Hannold L Lucy Boardman

## 2024-08-12 NOTE — H&P (Signed)
 Justin Dickson is an 53 y.o. male.   Chief Complaint: screening colonoscopy HPI: 53 y/o M with PMH HTN, coming for screening colonoscopy. The patient has never had a colonoscopy in the past.  The patient denies having any complaints such as melena, hematochezia, abdominal pain or distention, change in her bowel movement consistency or frequency, no changes in weight recently.  No family history of colorectal cancer.   Past Medical History:  Diagnosis Date   Essential hypertension 12/17/2023   Mandible fracture (HCC)    Open fracture of right side of mandibular body with routine healing 07/31/2016    Past Surgical History:  Procedure Laterality Date   CHOLECYSTECTOMY     MANDIBULAR HARDWARE REMOVAL N/A 09/08/2016   Procedure: MANDIBULAR HARDWARE REMOVAL;  Surgeon: Alm Bouche, MD;  Location: [facility];  Service: ENT;  Laterality: N/A;   ORIF MANDIBULAR FRACTURE Bilateral 07/08/2016   Procedure: CLOSED REDUCTION AND MAXILLOMANDIBULAR FIXATION SCREW PLACEMENT OF MANDIBULAR FRACTURE;  Surgeon: Alm Bouche, MD;  Location: [facility] OR;  Service: ENT;  Laterality: Bilateral;    History reviewed. No pertinent family history. Social History:  reports that he has never smoked. He has never used smokeless tobacco. He reports current alcohol use. He reports that he does not use drugs.  Allergies: No Known Allergies  Medications Prior to Admission  Medication Sig Dispense Refill   olmesartan (BENICAR) 20 MG tablet Take 20 mg by mouth daily.      No results found for this or any previous visit (from the past 48 hours). No results found.  Review of Systems  All other systems reviewed and are negative.   Blood pressure (!) 142/88, temperature 98.2 F (36.8 C), resp. rate 17, SpO2 100%. Physical Exam  GENERAL: The patient is AO x3, in no acute distress. HEENT: Head is normocephalic and atraumatic. EOMI are intact. Mouth is well hydrated and without lesions. NECK: Supple.  No masses LUNGS: Clear to auscultation. No presence of rhonchi/wheezing/rales. Adequate chest expansion HEART: RRR, normal s1 and s2. ABDOMEN: Soft, nontender, no guarding, no peritoneal signs, and nondistended. BS +. No masses. EXTREMITIES: Without any cyanosis, clubbing, rash, lesions or edema. NEUROLOGIC: AOx3, no focal motor deficit. SKIN: no jaundice, no rashes  Assessment/Plan 53 y/o M with PMH HTN, coming for screening colonoscopy. The patient is at average risk for colorectal cancer.  We will proceed with colonoscopy today.   Toribio Eartha Flavors, MD 08/12/2024, 9:49 AM

## 2024-08-12 NOTE — Transfer of Care (Signed)
 Immediate Anesthesia Transfer of Care Note  Patient: Justin Dickson  Procedure(s) Performed: COLONOSCOPY  Patient Location: Endoscopy Unit  Anesthesia Type:General  Level of Consciousness: drowsy, patient cooperative, and responds to stimulation  Airway & Oxygen Therapy: Patient Spontanous Breathing  Post-op Assessment: Report given to RN and Post -op Vital signs reviewed and stable  Post vital signs: Reviewed and stable  Last Vitals:  Vitals Value Taken Time  BP    Temp 36.4 C 08/12/24 10:22  Pulse 69 08/12/24 10:22  Resp 18 08/12/24 10:22  SpO2 98 % 08/12/24 10:22    Last Pain:  Vitals:   08/12/24 1022  TempSrc: Oral  PainSc: 0-No pain         Complications: No notable events documented.

## 2024-08-12 NOTE — Op Note (Signed)
 [facility] Patient Name: Justin Dickson Procedure Date: 08/12/2024 9:43 AM MRN: [id number] Date of Birth: 31-Jan-1971 Attending MD: Toribio Fortune , , [id number] CSN: [id number] Age: 53 Admit Type: Outpatient Procedure:                Colonoscopy Indications:              Screening for colorectal malignant neoplasm Providers:                Toribio Fortune, Leandrew Edelman RN, RN, Bascom Blush Referring MD:              Medicines:                Monitored Anesthesia Care Complications:            No immediate complications. Estimated Blood Loss:     Estimated blood loss: none. Procedure:                Pre-Anesthesia Assessment:                           - Prior to the procedure, a History and Physical                            was performed, and patient medications, allergies                            and sensitivities were reviewed. The patient's                            tolerance of previous anesthesia was reviewed.                           - The risks and benefits of the procedure and the                            sedation options and risks were discussed with the                            patient. All questions were answered and informed                            consent was obtained.                           - ASA Grade Assessment: II - A patient with mild                            systemic disease.                           After obtaining informed consent, the colonoscope                            was passed under direct vision. Throughout the                            procedure, the patient's  blood pressure, pulse, and                            oxygen saturations were monitored continuously. The                            [id number]-[id number] ([id number]) Peds Colon was introduced                            through the anus and advanced to the the cecum,                            identified by appendiceal orifice and ileocecal                            valve. The  colonoscopy was performed without                            difficulty. The patient tolerated the procedure                            well. The quality of the bowel preparation was good. Scope In: 9:59:47 AM Scope Out: 10:17:40 AM Scope Withdrawal Time: 0 hours 9 minutes 24 seconds  Total Procedure Duration: 0 hours 17 minutes 53 seconds  Findings:      The perianal and digital rectal examinations were normal.      A few small-mouthed diverticula were found in the sigmoid colon and       ascending colon.      A 5 mm polyp was found in the rectum. The polyp was sessile. The polyp       was removed with a cold snare. Resection and retrieval were complete.      Non-bleeding internal hemorrhoids were found during retroflexion. The       hemorrhoids were small. Impression:               - Diverticulosis in the sigmoid colon and in the                            ascending colon.                           - One 5 mm polyp in the rectum, removed with a cold                            snare. Resected and retrieved.                           - Non-bleeding internal hemorrhoids. Moderate Sedation:      Per Anesthesia Care Recommendation:           - Discharge patient to home (ambulatory).                           - Resume previous diet.                           -  Await pathology results.                           - Repeat colonoscopy date to be determined after                            pending pathology results are reviewed for                            screening purposes. Procedure Code(s):        --- Professional ---                           [phone], LT, Colonoscopy, flexible; with removal of                            tumor(s), polyp(s), or other lesion(s) by snare                            technique Diagnosis Code(s):        --- Professional ---                           Z12.11, Encounter for screening for malignant                            neoplasm of colon                            K64.8, Other hemorrhoids                           D12.8, Benign neoplasm of rectum                           K57.30, Diverticulosis of large intestine without                            perforation or abscess without bleeding CPT copyright 2022 [employer]. All rights reserved. The codes documented in this report are preliminary and upon coder review may  be revised to meet current compliance requirements. Toribio Fortune, MD Toribio Fortune,  08/12/2024 10:21:43 AM This report has been signed electronically. Number of Addenda: 0

## 2024-08-12 NOTE — Anesthesia Preprocedure Evaluation (Signed)
 Anesthesia Evaluation  Patient identified by MRN, date of birth, ID band Patient awake    Reviewed: Allergy & Precautions, H&P , NPO status , Patient's Chart, lab work & pertinent test results, reviewed documented beta blocker date and time   Airway Mallampati: II  TM Distance: >3 FB Neck ROM: full    Dental no notable dental hx. (+) Teeth Intact, Dental Advisory Given   Pulmonary neg pulmonary ROS   Pulmonary exam normal breath sounds clear to auscultation       Cardiovascular Exercise Tolerance: Good hypertension, Normal cardiovascular exam Rhythm:regular Rate:Normal     Neuro/Psych negative neurological ROS  negative psych ROS   GI/Hepatic negative GI ROS, Neg liver ROS,,,  Endo/Other  negative endocrine ROS    Renal/GU negative Renal ROS  negative genitourinary   Musculoskeletal   Abdominal   Peds  Hematology negative hematology ROS (+)   Anesthesia Other Findings   Reproductive/Obstetrics negative OB ROS                              Anesthesia Physical Anesthesia Plan  ASA: 1  Anesthesia Plan: General   Post-op Pain Management: Minimal or no pain anticipated   Induction: Intravenous  PONV Risk Score and Plan: Propofol  infusion  Airway Management Planned: Natural Airway and Nasal Cannula  Additional Equipment: None  Intra-op Plan:   Post-operative Plan:   Informed Consent: I have reviewed the patients History and Physical, chart, labs and discussed the procedure including the risks, benefits and alternatives for the proposed anesthesia with the patient or authorized representative who has indicated his/her understanding and acceptance.     Dental Advisory Given  Plan Discussed with: CRNA  Anesthesia Plan Comments:          Anesthesia Quick Evaluation

## 2024-08-13 ENCOUNTER — Ambulatory Visit (INDEPENDENT_AMBULATORY_CARE_PROVIDER_SITE_OTHER): Payer: Self-pay | Admitting: Gastroenterology

## 2024-08-13 LAB — SURGICAL PATHOLOGY

## 2024-08-15 ENCOUNTER — Encounter (HOSPITAL_COMMUNITY): Payer: Self-pay | Admitting: Gastroenterology

## 2024-08-15 NOTE — Progress Notes (Signed)
 7 yr TCS noted in recall Patient result letter mailed
